# Patient Record
Sex: Female | Born: 1950 | Race: White | Hispanic: No
Health system: Southern US, Community
[De-identification: ages and names within clinical notes are randomized; demographics above are authoritative.]

---

## 2006-08-14 ENCOUNTER — Ambulatory Visit: Payer: Self-pay | Admitting: Gastroenterology

## 2008-09-15 ENCOUNTER — Emergency Department: Payer: Self-pay | Admitting: Emergency Medicine

## 2009-11-14 ENCOUNTER — Ambulatory Visit: Payer: Self-pay | Admitting: Family Medicine

## 2009-11-15 ENCOUNTER — Ambulatory Visit: Payer: Self-pay | Admitting: Gynecologic Oncology

## 2009-11-24 ENCOUNTER — Ambulatory Visit: Payer: Self-pay | Admitting: Obstetrics & Gynecology

## 2009-12-01 ENCOUNTER — Inpatient Hospital Stay: Payer: Self-pay | Admitting: Obstetrics & Gynecology

## 2009-12-09 ENCOUNTER — Ambulatory Visit: Payer: Self-pay | Admitting: Oncology

## 2009-12-09 LAB — CEA: CEA: 0.6 ng/mL (ref 0.0–4.7)

## 2009-12-13 ENCOUNTER — Ambulatory Visit: Payer: Self-pay | Admitting: Gynecologic Oncology

## 2009-12-15 ENCOUNTER — Ambulatory Visit: Payer: Self-pay | Admitting: Gynecologic Oncology

## 2010-01-10 ENCOUNTER — Ambulatory Visit: Payer: Self-pay | Admitting: General Surgery

## 2010-01-15 ENCOUNTER — Ambulatory Visit: Payer: Self-pay | Admitting: Gynecologic Oncology

## 2010-02-15 ENCOUNTER — Ambulatory Visit: Payer: Self-pay | Admitting: Gynecologic Oncology

## 2010-03-16 ENCOUNTER — Ambulatory Visit: Payer: Self-pay | Admitting: Gynecologic Oncology

## 2010-03-29 ENCOUNTER — Ambulatory Visit: Payer: Self-pay | Admitting: Family Medicine

## 2010-04-16 ENCOUNTER — Ambulatory Visit: Payer: Self-pay | Admitting: Gynecologic Oncology

## 2010-05-16 ENCOUNTER — Ambulatory Visit: Payer: Self-pay | Admitting: Gynecologic Oncology

## 2010-06-16 ENCOUNTER — Ambulatory Visit: Payer: Self-pay | Admitting: Gynecologic Oncology

## 2010-07-16 ENCOUNTER — Ambulatory Visit: Payer: Self-pay | Admitting: Gynecologic Oncology

## 2010-08-30 ENCOUNTER — Ambulatory Visit: Payer: Self-pay | Admitting: Family Medicine

## 2010-10-11 ENCOUNTER — Ambulatory Visit: Payer: Self-pay | Admitting: Oncology

## 2010-10-12 LAB — CA 125: CA 125: 14.8 U/mL (ref 0.0–34.0)

## 2010-10-16 ENCOUNTER — Ambulatory Visit: Payer: Self-pay | Admitting: Oncology

## 2010-11-16 ENCOUNTER — Ambulatory Visit: Payer: Self-pay | Admitting: Oncology

## 2010-12-05 ENCOUNTER — Ambulatory Visit: Payer: Self-pay | Admitting: Gynecologic Oncology

## 2010-12-12 ENCOUNTER — Inpatient Hospital Stay: Payer: Self-pay | Admitting: Surgery

## 2010-12-13 DIAGNOSIS — R000 Tachycardia, unspecified: Secondary | ICD-10-CM

## 2010-12-15 LAB — PATHOLOGY REPORT

## 2010-12-16 ENCOUNTER — Ambulatory Visit: Payer: Self-pay | Admitting: Oncology

## 2011-01-03 ENCOUNTER — Inpatient Hospital Stay: Payer: Self-pay | Admitting: Oncology

## 2011-01-16 ENCOUNTER — Emergency Department: Payer: Self-pay | Admitting: Emergency Medicine

## 2011-01-16 ENCOUNTER — Ambulatory Visit: Payer: Self-pay | Admitting: Oncology

## 2011-01-22 ENCOUNTER — Ambulatory Visit: Payer: Self-pay | Admitting: Surgery

## 2011-01-22 LAB — BASIC METABOLIC PANEL
Anion Gap: 7 (ref 7–16)
Calcium, Total: 8.8 mg/dL (ref 8.5–10.1)
Co2: 31 mmol/L (ref 21–32)
Creatinine: 0.62 mg/dL (ref 0.60–1.30)
EGFR (African American): >60
EGFR (Non-African Amer.): >60
Glucose: 113 mg/dL — ABNORMAL HIGH (ref 65–99)

## 2011-01-22 LAB — CBC WITH DIFFERENTIAL/PLATELET
Basophil %: 0.4 %
Eosinophil %: 2.8 %
HGB: 9 g/dL — ABNORMAL LOW (ref 12.0–16.0)
Lymphocyte %: 19.2 %
MCH: 27.8 pg (ref 26.0–34.0)
MCHC: 33 g/dL (ref 32.0–36.0)
Monocyte #: 0.5 10*3/uL (ref 0.0–0.7)
Monocyte %: 8.5 %
Neutrophil %: 69.1 %
RBC: 3.24 10*6/uL — ABNORMAL LOW (ref 3.80–5.20)
WBC: 6 10*3/uL (ref 3.6–11.0)

## 2011-01-24 ENCOUNTER — Inpatient Hospital Stay: Payer: Self-pay | Admitting: Surgery

## 2011-01-25 LAB — CBC WITH DIFFERENTIAL/PLATELET
Basophil %: 0.3 %
Eosinophil #: 0.1 10*3/uL (ref 0.0–0.7)
Eosinophil %: 1.9 %
Lymphocyte #: 1 10*3/uL (ref 1.0–3.6)
MCH: 27.8 pg (ref 26.0–34.0)
MCHC: 32.6 g/dL (ref 32.0–36.0)
MCV: 85 fL (ref 80–100)
Monocyte #: 0.5 10*3/uL (ref 0.0–0.7)
Monocyte %: 8.1 %
Neutrophil #: 5 10*3/uL (ref 1.4–6.5)
Neutrophil %: 74.5 %
Platelet: 303 10*3/uL (ref 150–440)
RBC: 3.14 10*6/uL — ABNORMAL LOW (ref 3.80–5.20)

## 2011-01-25 LAB — BASIC METABOLIC PANEL
Anion Gap: 7 (ref 7–16)
BUN: 8 mg/dL (ref 7–18)
Chloride: 101 mmol/L (ref 98–107)
Co2: 29 mmol/L (ref 21–32)
Creatinine: 0.58 mg/dL — ABNORMAL LOW (ref 0.60–1.30)
Osmolality: 274 (ref 275–301)
Potassium: 4.4 mmol/L (ref 3.5–5.1)
Sodium: 137 mmol/L (ref 136–145)

## 2011-01-27 LAB — CBC WITH DIFFERENTIAL/PLATELET
Basophil #: 0 10*3/uL (ref 0.0–0.1)
Basophil %: 0.1 %
Eosinophil #: 0.2 10*3/uL (ref 0.0–0.7)
HCT: 23.3 % — ABNORMAL LOW (ref 35.0–47.0)
Lymphocyte #: 0.9 10*3/uL — ABNORMAL LOW (ref 1.0–3.6)
Lymphocyte %: 18.4 %
MCH: 27.8 pg (ref 26.0–34.0)
MCHC: 32.8 g/dL (ref 32.0–36.0)
MCV: 85 fL (ref 80–100)
Monocyte #: 0.4 10*3/uL (ref 0.0–0.7)
Neutrophil #: 3.5 10*3/uL (ref 1.4–6.5)
RDW: 15.5 % — ABNORMAL HIGH (ref 11.5–14.5)

## 2011-01-27 LAB — BASIC METABOLIC PANEL
Anion Gap: 9 (ref 7–16)
Calcium, Total: 8.2 mg/dL — ABNORMAL LOW (ref 8.5–10.1)
Chloride: 102 mmol/L (ref 98–107)
Co2: 29 mmol/L (ref 21–32)
EGFR (African American): >60
Glucose: 105 mg/dL — ABNORMAL HIGH (ref 65–99)
Sodium: 140 mmol/L (ref 136–145)

## 2011-01-27 LAB — URINALYSIS, COMPLETE
Bacteria: NONE SEEN
Bilirubin,UR: NEGATIVE
Ketone: NEGATIVE
Ph: 7 (ref 4.5–8.0)
Protein: NEGATIVE
Specific Gravity: 1.002 (ref 1.003–1.030)
Squamous Epithelial: NONE SEEN
WBC UR: 45 /HPF (ref 0–5)

## 2011-01-29 LAB — URINE CULTURE

## 2011-02-09 LAB — CBC CANCER CENTER
Basophil #: 0 x10 3/mm (ref 0.0–0.1)
Basophil %: 0.6 %
Eosinophil #: 0.2 x10 3/mm (ref 0.0–0.7)
Eosinophil %: 3.4 %
HCT: 30.3 % — ABNORMAL LOW (ref 35.0–47.0)
Lymphocyte #: 1.6 x10 3/mm (ref 1.0–3.6)
MCHC: 32.2 g/dL (ref 32.0–36.0)
MCV: 84.8 fL (ref 80–100)
Monocyte #: 0.4 x10 3/mm (ref 0.0–0.7)
Neutrophil %: 67.3 %
Platelet: 428 x10 3/mm (ref 150–440)
RDW: 16.1 % — ABNORMAL HIGH (ref 11.5–14.5)
WBC: 6.9 x10 3/mm (ref 3.6–11.0)

## 2011-02-09 LAB — COMPREHENSIVE METABOLIC PANEL
Albumin: 2.8 g/dL — ABNORMAL LOW (ref 3.4–5.0)
Alkaline Phosphatase: 95 U/L (ref 50–136)
BUN: 9 mg/dL (ref 7–18)
Calcium, Total: 9.7 mg/dL (ref 8.5–10.1)
Co2: 31 mmol/L (ref 21–32)
SGOT(AST): 19 U/L (ref 15–37)
SGPT (ALT): 16 U/L
Sodium: 139 mmol/L (ref 136–145)
Total Protein: 7.2 g/dL (ref 6.4–8.2)

## 2011-02-16 ENCOUNTER — Ambulatory Visit: Payer: Self-pay | Admitting: Oncology

## 2011-02-23 LAB — COMPREHENSIVE METABOLIC PANEL
Albumin: 2.6 g/dL — ABNORMAL LOW (ref 3.4–5.0)
Alkaline Phosphatase: 151 U/L — ABNORMAL HIGH (ref 50–136)
Anion Gap: 9 (ref 7–16)
BUN: 16 mg/dL (ref 7–18)
Bilirubin,Total: 0.2 mg/dL (ref 0.2–1.0)
Calcium, Total: 9 mg/dL (ref 8.5–10.1)
Chloride: 97 mmol/L — ABNORMAL LOW (ref 98–107)
Co2: 29 mmol/L (ref 21–32)
Creatinine: 0.75 mg/dL (ref 0.60–1.30)
EGFR (African American): >60
Glucose: 218 mg/dL — ABNORMAL HIGH (ref 65–99)
Osmolality: 278 (ref 275–301)
Potassium: 3.6 mmol/L (ref 3.5–5.1)
SGOT(AST): 13 U/L — ABNORMAL LOW (ref 15–37)
Sodium: 135 mmol/L — ABNORMAL LOW (ref 136–145)
Total Protein: 6.7 g/dL (ref 6.4–8.2)

## 2011-02-23 LAB — CBC CANCER CENTER
Basophil #: 0 x10 3/mm (ref 0.0–0.1)
Basophil %: 0.1 %
Eosinophil %: 1.5 %
HCT: 30.1 % — ABNORMAL LOW (ref 35.0–47.0)
HGB: 9.7 g/dL — ABNORMAL LOW (ref 12.0–16.0)
Lymphocyte #: 1.3 x10 3/mm (ref 1.0–3.6)
MCH: 27.1 pg (ref 26.0–34.0)
MCHC: 32.2 g/dL (ref 32.0–36.0)
MCV: 84.1 fL (ref 80–100)
Monocyte #: 0 x10 3/mm (ref 0.0–0.7)
RBC: 3.58 10*6/uL — ABNORMAL LOW (ref 3.80–5.20)

## 2011-03-05 LAB — CBC CANCER CENTER
Basophil #: 0.1 x10 3/mm (ref 0.0–0.1)
Basophil %: 0.5 %
Eosinophil #: 0.2 x10 3/mm (ref 0.0–0.7)
Lymphocyte %: 16.1 %
MCH: 26.3 pg (ref 26.0–34.0)
MCV: 83 fL (ref 80–100)
Neutrophil %: 78.2 %
RBC: 3.84 10*6/uL (ref 3.80–5.20)
RDW: 16.4 % — ABNORMAL HIGH (ref 11.5–14.5)
WBC: 14.5 x10 3/mm — ABNORMAL HIGH (ref 3.6–11.0)

## 2011-03-09 LAB — CBC CANCER CENTER
Basophil #: 0.1 x10 3/mm (ref 0.0–0.1)
Eosinophil #: 0.2 x10 3/mm (ref 0.0–0.7)
Lymphocyte %: 15.2 %
MCH: 27 pg (ref 26.0–34.0)
MCHC: 32.9 g/dL (ref 32.0–36.0)
MCV: 82.3 fL (ref 80–100)
Monocyte #: 0.8 x10 3/mm — ABNORMAL HIGH (ref 0.0–0.7)
Neutrophil %: 75.4 %
Platelet: 535 x10 3/mm — ABNORMAL HIGH (ref 150–440)
RDW: 16.9 % — ABNORMAL HIGH (ref 11.5–14.5)
WBC: 11.8 x10 3/mm — ABNORMAL HIGH (ref 3.6–11.0)

## 2011-03-09 LAB — BASIC METABOLIC PANEL
Anion Gap: 9 (ref 7–16)
BUN: 11 mg/dL (ref 7–18)
Calcium, Total: 9.5 mg/dL (ref 8.5–10.1)
Chloride: 102 mmol/L (ref 98–107)
Co2: 31 mmol/L (ref 21–32)
Creatinine: 0.8 mg/dL (ref 0.60–1.30)
Potassium: 4.1 mmol/L (ref 3.5–5.1)

## 2011-03-13 LAB — URINALYSIS, COMPLETE
Bilirubin,UR: NEGATIVE
Blood: NEGATIVE
Glucose,UR: NEGATIVE mg/dL (ref 0–75)
Hyaline Cast: 4
Specific Gravity: 1.033 (ref 1.003–1.030)
Squamous Epithelial: 11

## 2011-03-14 LAB — URINE CULTURE

## 2011-03-16 ENCOUNTER — Ambulatory Visit: Payer: Self-pay | Admitting: Oncology

## 2011-03-16 LAB — CBC CANCER CENTER
Basophil #: 0.1 x10 3/mm (ref 0.0–0.1)
Basophil %: 1 %
Eosinophil #: 0.6 x10 3/mm (ref 0.0–0.7)
Eosinophil %: 4.5 %
HCT: 28 % — ABNORMAL LOW (ref 35.0–47.0)
HGB: 9.2 g/dL — ABNORMAL LOW (ref 12.0–16.0)
Lymphocyte #: 1.7 x10 3/mm (ref 1.0–3.6)
MCH: 27.1 pg (ref 26.0–34.0)
MCV: 82 fL (ref 80–100)
Monocyte #: 0.2 x10 3/mm (ref 0.0–0.7)
Monocyte %: 1.7 %
Neutrophil %: 80 %
Platelet: 475 x10 3/mm — ABNORMAL HIGH (ref 150–440)
RBC: 3.41 10*6/uL — ABNORMAL LOW (ref 3.80–5.20)
RDW: 17 % — ABNORMAL HIGH (ref 11.5–14.5)
WBC: 13.6 x10 3/mm — ABNORMAL HIGH (ref 3.6–11.0)

## 2011-03-30 LAB — BASIC METABOLIC PANEL
Anion Gap: 8 (ref 7–16)
BUN: 10 mg/dL (ref 7–18)
Calcium, Total: 9.3 mg/dL (ref 8.5–10.1)
Co2: 31 mmol/L (ref 21–32)
Creatinine: 0.79 mg/dL (ref 0.60–1.30)
EGFR (African American): >60
EGFR (Non-African Amer.): >60
Glucose: 170 mg/dL — ABNORMAL HIGH (ref 65–99)
Osmolality: 282 (ref 275–301)

## 2011-03-30 LAB — CBC CANCER CENTER
Basophil %: 0.7 %
Eosinophil %: 2.5 %
Lymphocyte #: 1.9 x10 3/mm (ref 1.0–3.6)
Lymphocyte %: 15.9 %
MCHC: 32.1 g/dL (ref 32.0–36.0)
MCV: 81.3 fL (ref 80–100)
Monocyte %: 6 %
Neutrophil %: 74.9 %
WBC: 11.8 x10 3/mm — ABNORMAL HIGH (ref 3.6–11.0)

## 2011-04-05 LAB — CBC CANCER CENTER
Basophil #: 0.1 x10 3/mm (ref 0.0–0.1)
Basophil %: 0.3 %
Eosinophil #: 0.6 x10 3/mm (ref 0.0–0.7)
Eosinophil %: 2.7 %
HGB: 8.9 g/dL — ABNORMAL LOW (ref 12.0–16.0)
Lymphocyte %: 7.3 %
MCH: 27.1 pg (ref 26.0–34.0)
MCHC: 32.9 g/dL (ref 32.0–36.0)
MCV: 82.6 fL (ref 80–100)
Monocyte #: 0.1 x10 3/mm (ref 0.0–0.7)
Platelet: 391 x10 3/mm (ref 150–440)
RBC: 3.3 10*6/uL — ABNORMAL LOW (ref 3.80–5.20)

## 2011-04-16 ENCOUNTER — Ambulatory Visit: Payer: Self-pay | Admitting: Oncology

## 2011-04-27 LAB — BASIC METABOLIC PANEL
Anion Gap: 7 (ref 7–16)
Calcium, Total: 9.2 mg/dL (ref 8.5–10.1)
Chloride: 102 mmol/L (ref 98–107)
Co2: 30 mmol/L (ref 21–32)
Glucose: 139 mg/dL — ABNORMAL HIGH (ref 65–99)
Osmolality: 277 (ref 275–301)

## 2011-04-27 LAB — CBC CANCER CENTER
Eosinophil #: 0.6 x10 3/mm (ref 0.0–0.7)
Eosinophil %: 5.6 %
HCT: 28.3 % — ABNORMAL LOW (ref 35.0–47.0)
Lymphocyte #: 1.7 x10 3/mm (ref 1.0–3.6)
MCHC: 31.7 g/dL — ABNORMAL LOW (ref 32.0–36.0)
MCV: 83 fL (ref 80–100)
Monocyte #: 0.8 x10 3/mm (ref 0.2–0.9)
Neutrophil %: 68.2 %
Platelet: 399 x10 3/mm (ref 150–440)
RBC: 3.43 10*6/uL — ABNORMAL LOW (ref 3.80–5.20)
WBC: 9.8 x10 3/mm (ref 3.6–11.0)

## 2011-05-04 LAB — CBC CANCER CENTER
Basophil #: 0.1 x10 3/mm (ref 0.0–0.1)
Basophil %: 0.6 %
Eosinophil #: 0.5 x10 3/mm (ref 0.0–0.7)
Eosinophil %: 4.5 %
Lymphocyte #: 1.3 x10 3/mm (ref 1.0–3.6)
MCHC: 32 g/dL (ref 32.0–36.0)
Neutrophil #: 8.3 x10 3/mm — ABNORMAL HIGH (ref 1.4–6.5)
Platelet: 267 x10 3/mm (ref 150–440)
RBC: 3.28 10*6/uL — ABNORMAL LOW (ref 3.80–5.20)
RDW: 19.2 % — ABNORMAL HIGH (ref 11.5–14.5)

## 2011-05-04 LAB — COMPREHENSIVE METABOLIC PANEL
Albumin: 3 g/dL — ABNORMAL LOW (ref 3.4–5.0)
Anion Gap: 8 (ref 7–16)
Bilirubin,Total: 0.1 mg/dL — ABNORMAL LOW (ref 0.2–1.0)
Calcium, Total: 9.3 mg/dL (ref 8.5–10.1)
Chloride: 95 mmol/L — ABNORMAL LOW (ref 98–107)
Creatinine: 0.6 mg/dL (ref 0.60–1.30)
EGFR (African American): >60
EGFR (Non-African Amer.): >60
Glucose: 98 mg/dL (ref 65–99)
SGOT(AST): 16 U/L (ref 15–37)

## 2011-05-16 ENCOUNTER — Ambulatory Visit: Payer: Self-pay | Admitting: Oncology

## 2011-06-15 LAB — CBC CANCER CENTER
Eosinophil #: 0.4 x10 3/mm (ref 0.0–0.7)
Eosinophil %: 4.2 %
HCT: 26.3 % — ABNORMAL LOW (ref 35.0–47.0)
HGB: 8.1 g/dL — ABNORMAL LOW (ref 12.0–16.0)
MCHC: 31 g/dL — ABNORMAL LOW (ref 32.0–36.0)
MCV: 81 fL (ref 80–100)
Monocyte #: 0.7 x10 3/mm (ref 0.2–0.9)
Monocyte %: 7.7 %
Neutrophil %: 68.1 %
RBC: 3.24 10*6/uL — ABNORMAL LOW (ref 3.80–5.20)
RDW: 19.2 % — ABNORMAL HIGH (ref 11.5–14.5)
WBC: 8.7 x10 3/mm (ref 3.6–11.0)

## 2011-06-15 LAB — COMPREHENSIVE METABOLIC PANEL
Albumin: 2.6 g/dL — ABNORMAL LOW (ref 3.4–5.0)
Alkaline Phosphatase: 98 U/L (ref 50–136)
BUN: 8 mg/dL (ref 7–18)
Calcium, Total: 9.1 mg/dL (ref 8.5–10.1)
Creatinine: 0.66 mg/dL (ref 0.60–1.30)
EGFR (African American): >60
EGFR (Non-African Amer.): >60
Total Protein: 6.8 g/dL (ref 6.4–8.2)

## 2011-06-16 ENCOUNTER — Ambulatory Visit: Payer: Self-pay | Admitting: Oncology

## 2011-07-16 ENCOUNTER — Ambulatory Visit: Payer: Self-pay | Admitting: Oncology

## 2011-07-30 LAB — CBC CANCER CENTER
HCT: 27.9 % — ABNORMAL LOW (ref 35.0–47.0)
HGB: 8.4 g/dL — ABNORMAL LOW (ref 12.0–16.0)
Lymphocytes: 8 %
MCH: 24.1 pg — ABNORMAL LOW (ref 26.0–34.0)
MCV: 80 fL (ref 80–100)
RDW: 20.8 % — ABNORMAL HIGH (ref 11.5–14.5)
Segmented Neutrophils: 84 %
WBC: 31 x10 3/mm — ABNORMAL HIGH (ref 3.6–11.0)

## 2011-08-07 LAB — CBC CANCER CENTER
Basophil %: 0.3 %
Eosinophil #: 0.1 x10 3/mm (ref 0.0–0.7)
Eosinophil %: 0.4 %
HCT: 28.2 % — ABNORMAL LOW (ref 35.0–47.0)
Lymphocyte #: 1.1 x10 3/mm (ref 1.0–3.6)
MCH: 24.4 pg — ABNORMAL LOW (ref 26.0–34.0)
MCHC: 30.7 g/dL — ABNORMAL LOW (ref 32.0–36.0)
MCV: 80 fL (ref 80–100)
Monocyte #: 1.2 x10 3/mm — ABNORMAL HIGH (ref 0.2–0.9)
Neutrophil %: 90 %
Platelet: 430 x10 3/mm (ref 150–440)
RBC: 3.55 10*6/uL — ABNORMAL LOW (ref 3.80–5.20)
RDW: 21 % — ABNORMAL HIGH (ref 11.5–14.5)

## 2011-08-16 ENCOUNTER — Ambulatory Visit: Payer: Self-pay | Admitting: Oncology

## 2011-09-16 DEATH — deceased

## 2011-12-05 IMAGING — CT CT CHEST W/ CM
1 series · 16 of 33 positions shown, 20 images · IV contrast (APPLIED)
Comparison: none

REASON FOR EXAM: STAT CR [id number] ask for Eteoklis Sorokos or Talbot chest
pain  dyspnea  eval PE
COMMENTS:

PROCEDURE:     SURY - SURY CHEST ( FOR PE ) W  - January 18, 2010  [DATE]
RESULT:     History: Chest pain.

[Series 4: soft tissue · axial · 0.64mm/px · z∈[+36,+315]mm · 16 of 101 slices shown, 20 images]
[im 4/101  mediastinal]
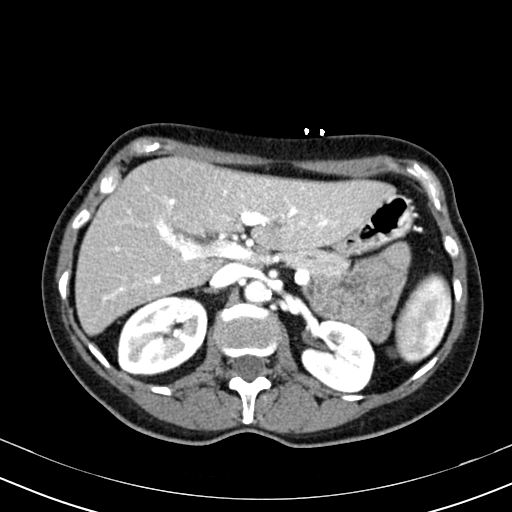
[im 4/101  lung]
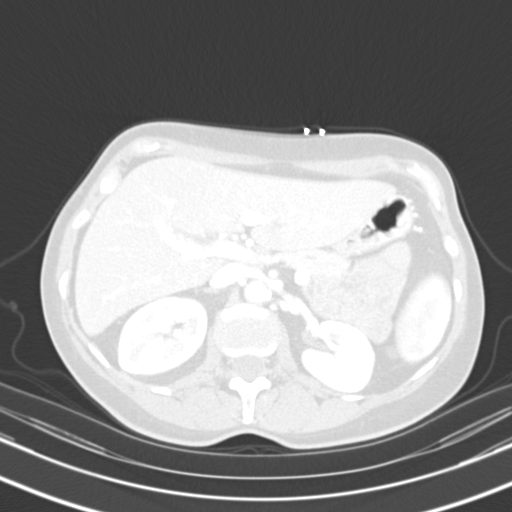
[im 12/101  lung]
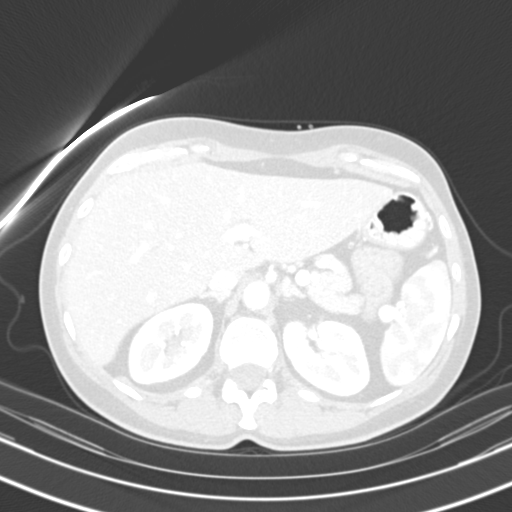
[im 19/101  lung]
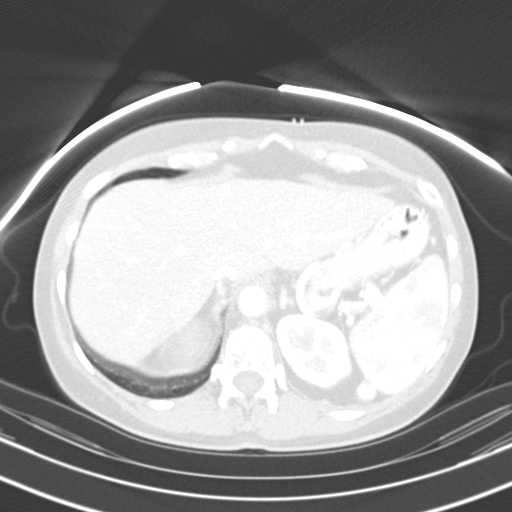
[im 23/101  lung]
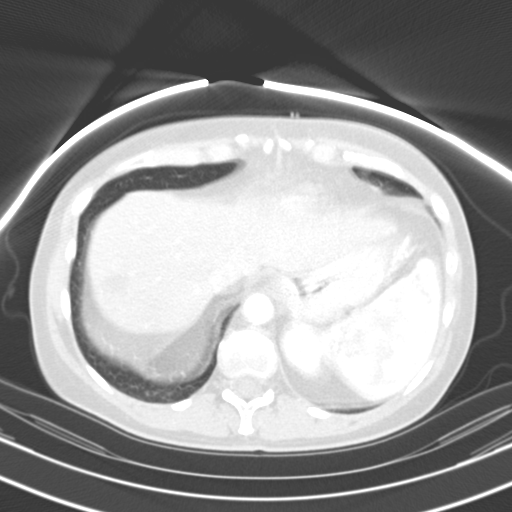
[im 30/101  mediastinal]
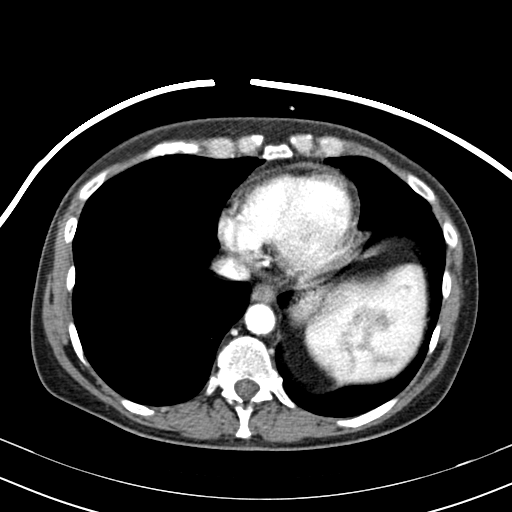
[im 30/101  lung]
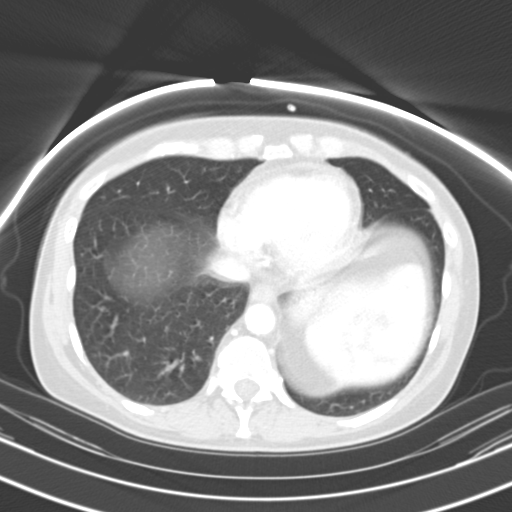
[im 38/101  lung]
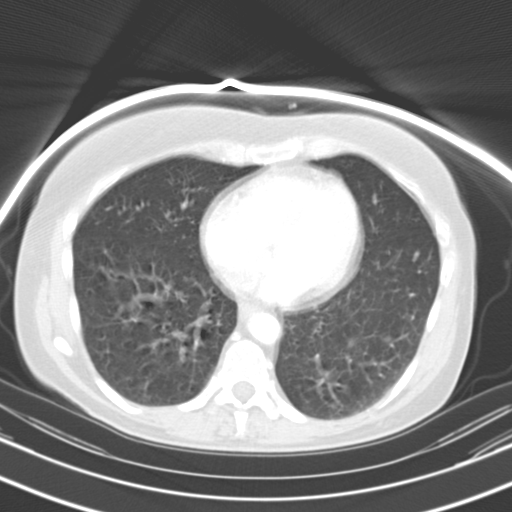
[im 45/101  lung]
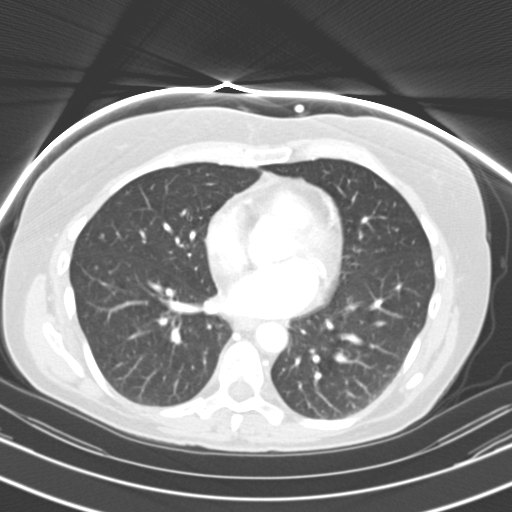
[im 49/101  lung]
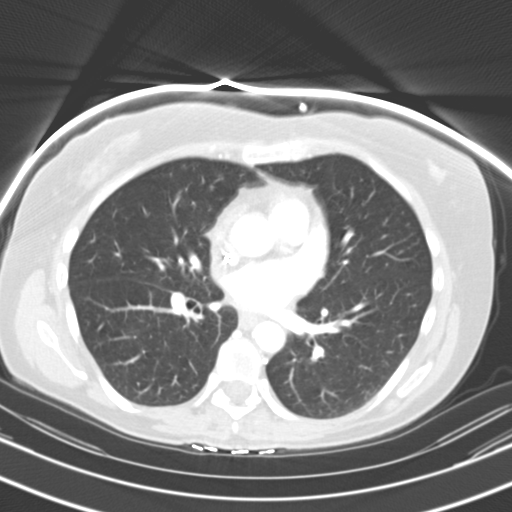
[im 54/101  mediastinal]
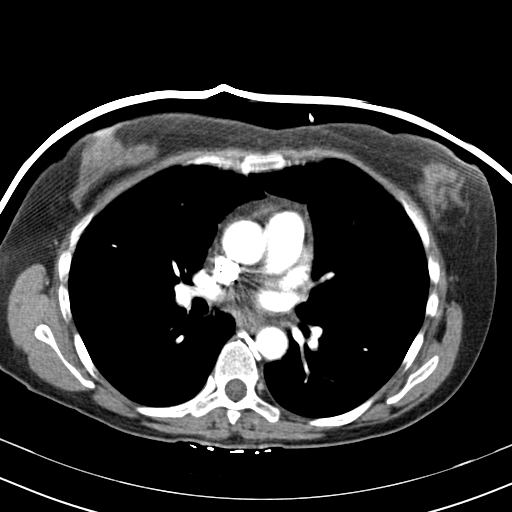
[im 54/101  lung]
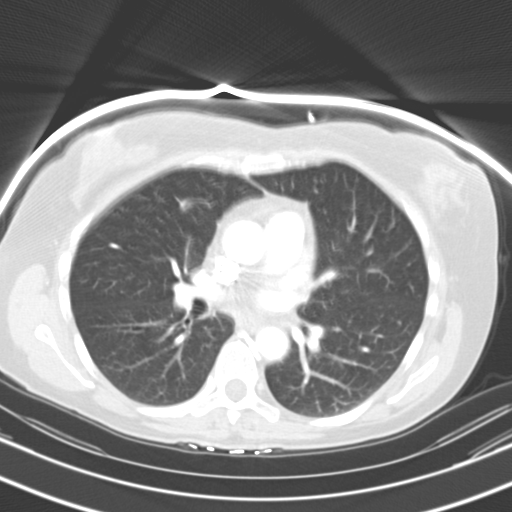
[im 60/101  lung]
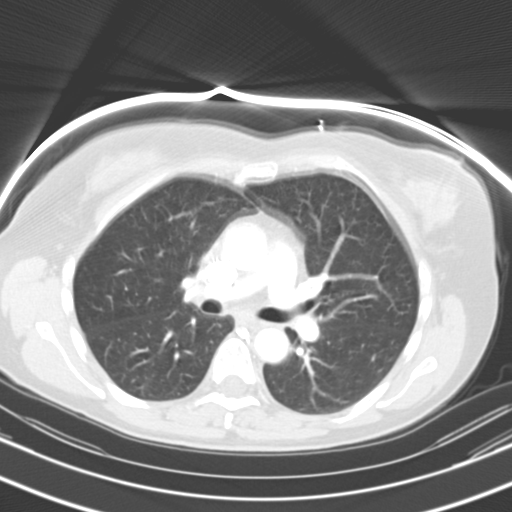
[im 63/101  lung]
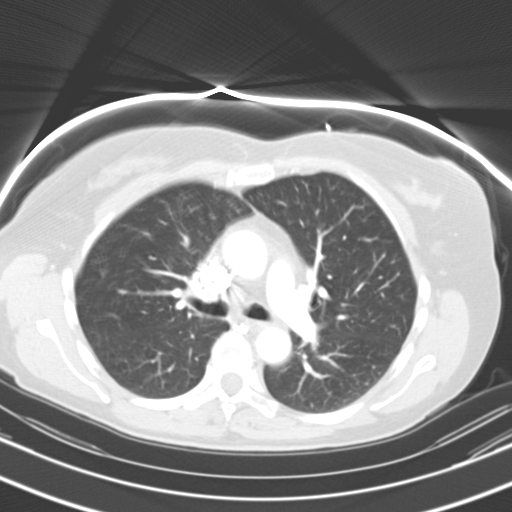
[im 71/101  lung]
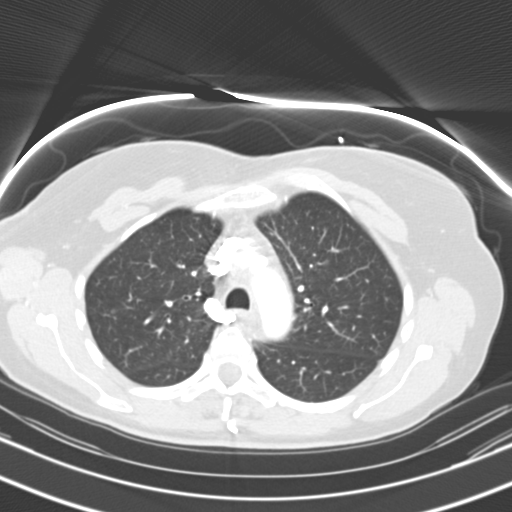
[im 78/101  mediastinal]
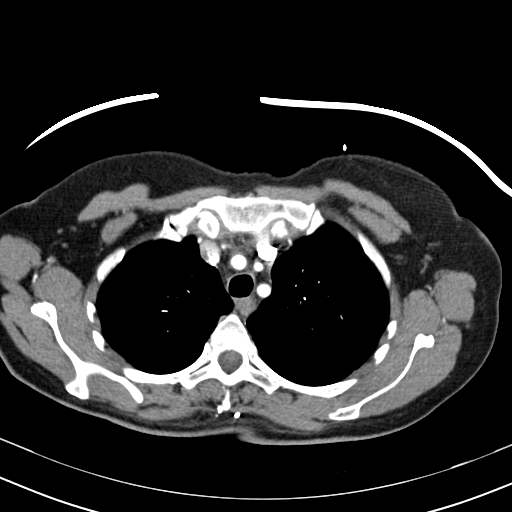
[im 78/101  lung]
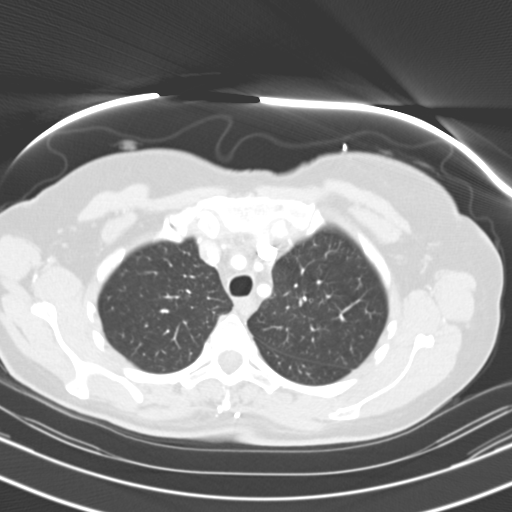
[im 82/101  lung]
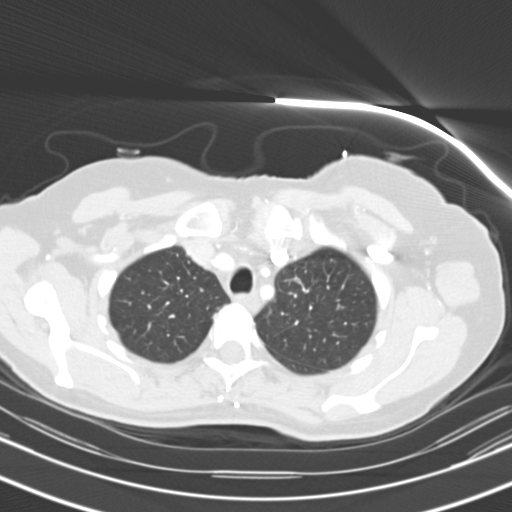
[im 89/101  lung]
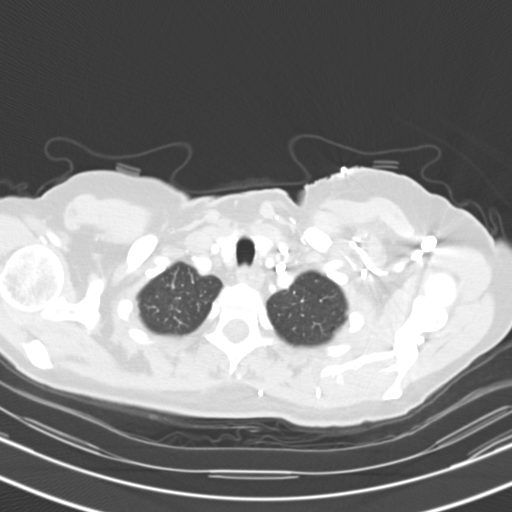
[im 97/101  lung]
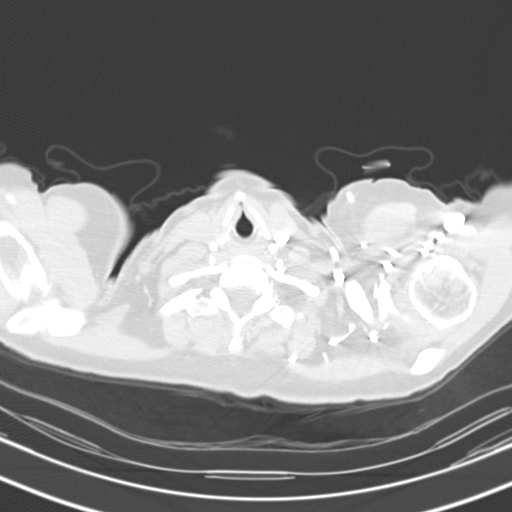

[16 of 33 positions shown; findings below may reference images not displayed]

FINDINGS: Standard CT chest obtained with 100 cc of Bsovue-CVT. Pulmonary
arteries normal. Adrenals normal. Thoracic aorta adrenals normal is
low-density lesion in the right lobe liver liver. This is indeterminate by
CT this could represent a solid lesion and hepatic ultrasound suggested for
further evaluation. A 6 mm nodule noted in right upper lobe.
IMPRESSION: 1. 6 mm right upper lobe nodule.
2. No acute abnormality.

## 2012-11-03 IMAGING — CR DG CHEST 1V
1 series · 1 of 1 positions shown · non-contrast
Comparison: none

REASON FOR EXAM: Complete VQ scan process per Nuc Med
COMMENTS:

[w chest pa]
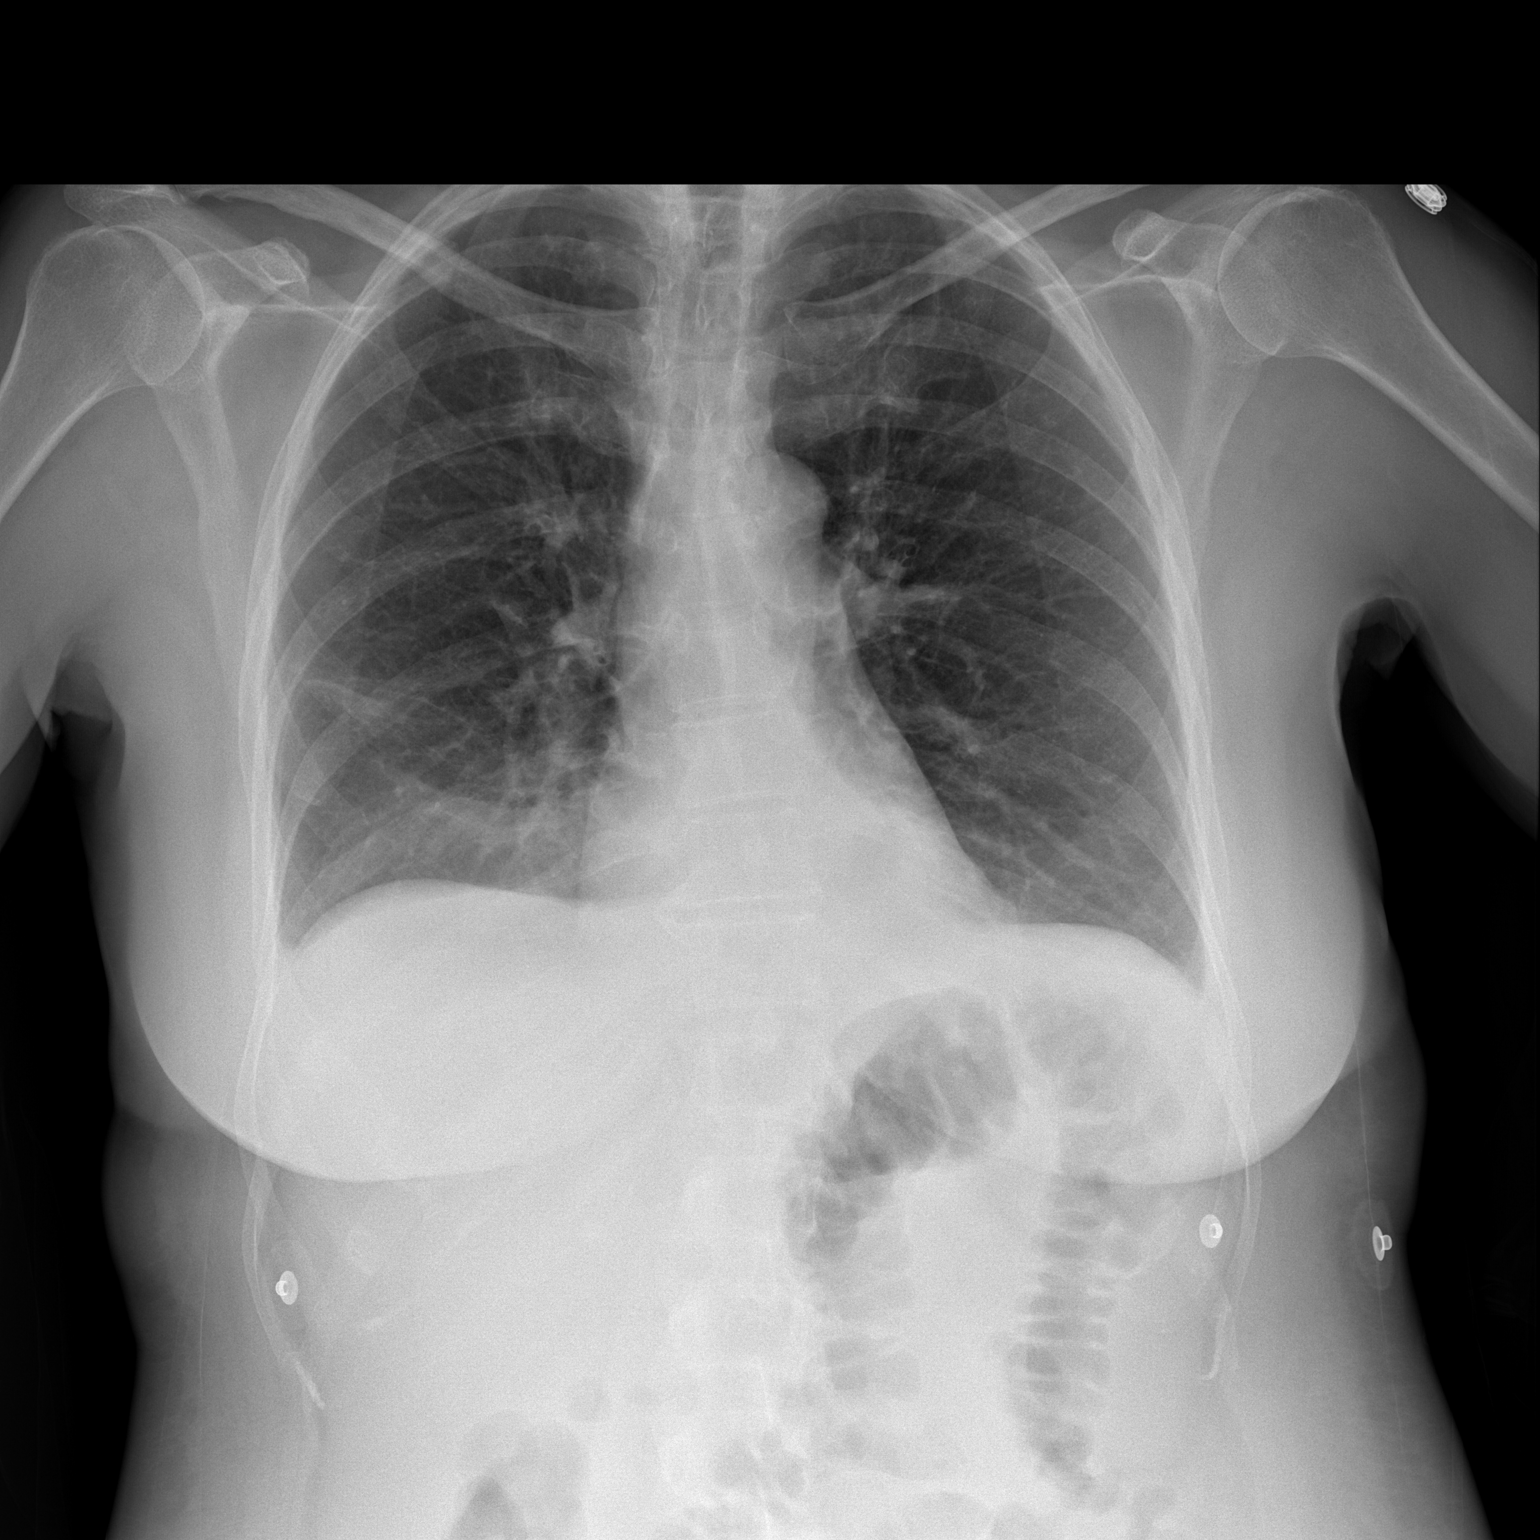

[1 of 1 positions shown; findings below may reference images not displayed]

PROCEDURE:     DXR - DXR CHEST 1 VIEWAP OR PA  - December 18, 2010 [DATE]

RESULT:     Comparison is made to the prior exam of 12/14/2010. There is
increased density at the right base compatible with atelectasis.
Additionally, there is thickening of the retrocardiac markings on the left
suspicious for minimal atelectasis or pneumonia. The upper lobes bilaterally
are clear. Heart size is normal. No pleural effusion is seen.
IMPRESSION: 1. There is increased density at both lung bases compatible with atelectasis
or pneumonia.
2. No definite pleural effusions are seen.
3. Heart size is normal.

## 2012-12-10 IMAGING — CR DG CHEST 1V PORT
1 series · 1 of 1 positions shown · non-contrast
Comparison: none

REASON FOR EXAM: Line placement
COMMENTS:

[portable]
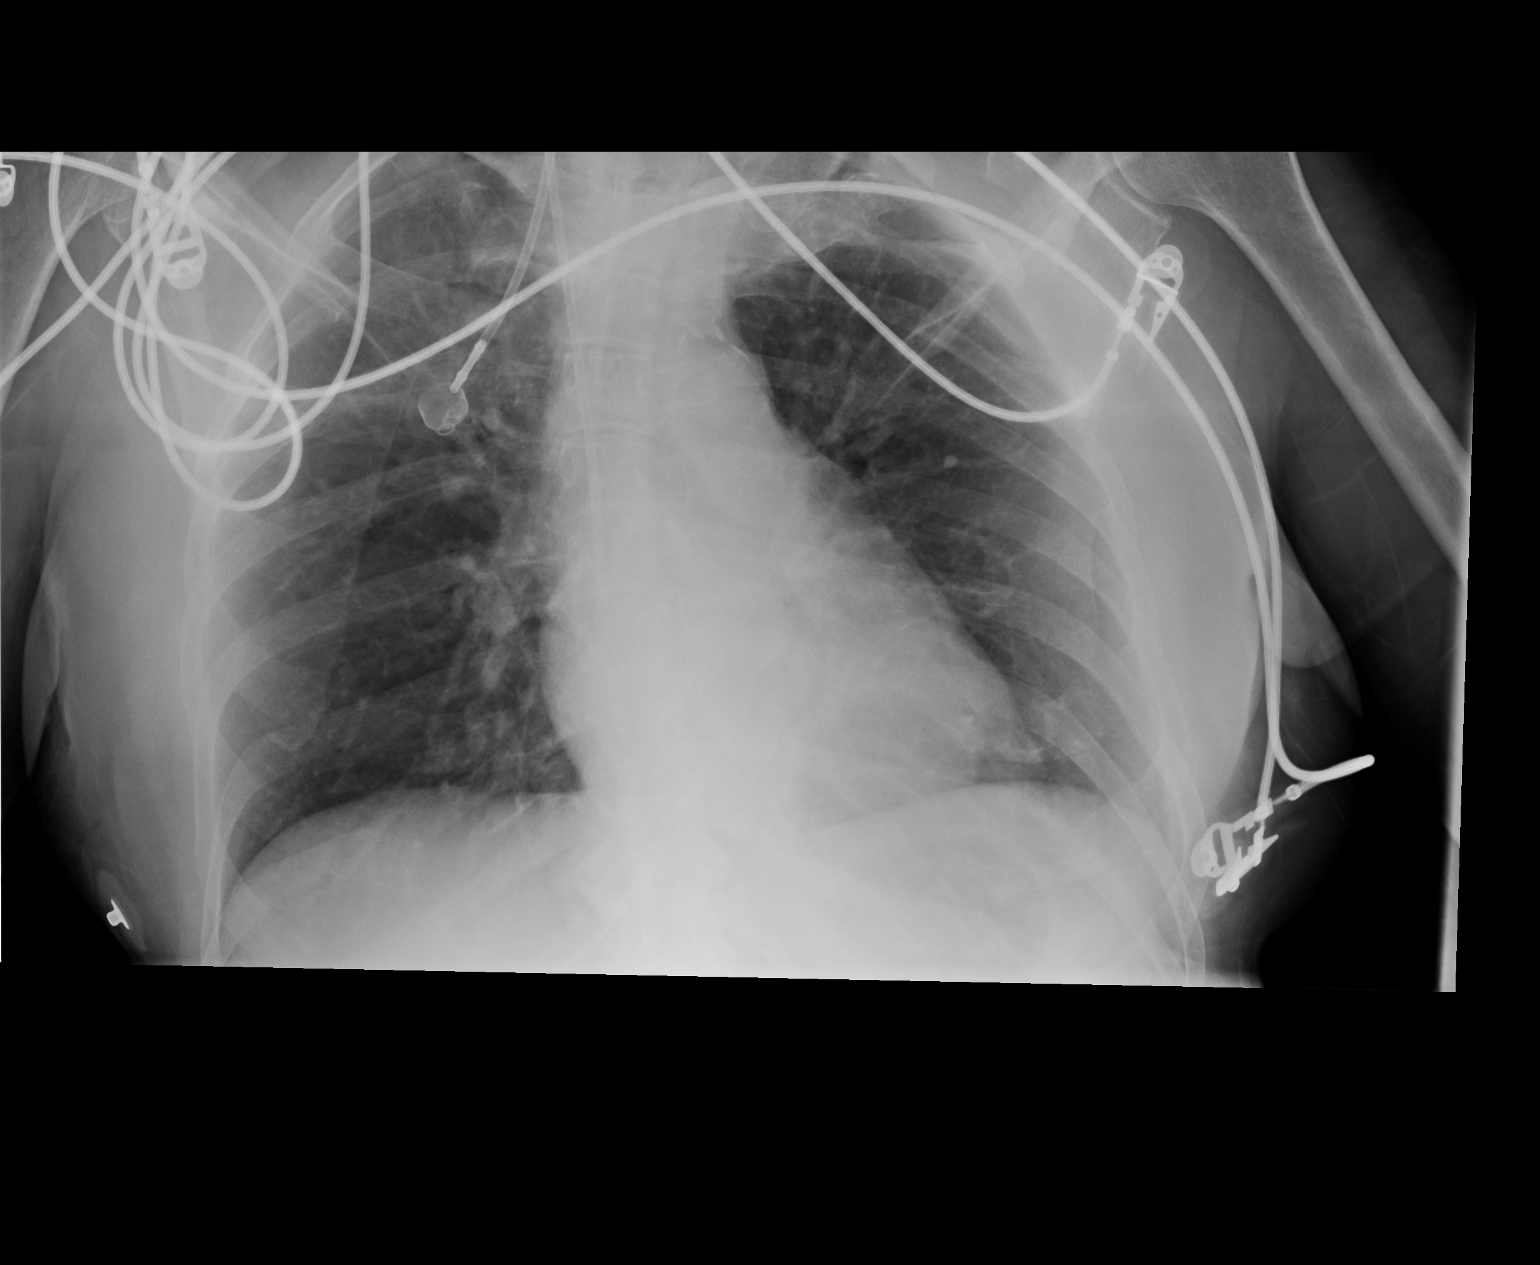

[1 of 1 positions shown; findings below may reference images not displayed]

PROCEDURE:     DXR - DXR PORTABLE CHEST SINGLE VIEW  - January 24, 2011 [DATE]

RESULT:     Comparison is made to a prior exam of 12/18/2010. A Port-A-Cath
is present with the tip projected at the junction of the superior vena cava
and right atrium. No pneumothorax is seen. The lung fields are clear of
infiltrate. The previously noted increased density at the lung base is no
longer seen. Monitoring electrodes are present.
IMPRESSION: 1. A Port-A-Cath is present with the tip projected at the junction of the
right atrium and superior vena cava.
2. No pneumothorax is seen.
3. Lung fields are clear.
4. Heart size is normal.

## 2014-05-04 NOTE — Consult Note (Signed)
Reason for Visit: This 64 year old Female patient presents to the clinic for initial evaluation of  Abdominal sarcoma .   Referred by Dr. Doylene Canning.  Diagnosis:   Chief Complaint/Diagnosis   64 year old female with progressive sarcoma involving her abdominal stoma with bleeding and increasing abdominal pain now seen for possible palliative radiation therapy   Pathology Report Pathology report reviewed    Imaging Report Prior CT scan reviewed with Dr. 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 She was initially treated with Taxotere and gemcitabine. She then underwent debulking surgery November of 2000 well for recurrent undifferentiated sarcoma. 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 Had a diverting colostomy in January 2013 followed up with Adriamycin as a single agent. She is now developed small bowel fistula and is currently under hospice care. She has recently presented with a rapid growth of tumor into her up abdominal wall stoma causing significant bleeding. She is also having increasing abdominal pain and I been asked to evaluate the patient for possible palliative radiation therapy. Her last CT scan was back in December of 2000 well. I reviewed that scan with Dr. Doylene Canning.  Past Hx:    HX: Diverticulosis:    Arrythmias:    H/O Seizures related to Norvasc:    Retroperitoneal sarcoma:    HX: Seizures:    HTN:    tumors removed in colon and stomach: Nov 2012   Colon Resection: 01-Dec-2009   Removal of porta Cath in office: May 2012   Porta Cath: 10-Jan-2010   colonoscopy:    Hysterectomy:   Past, Family and Social History:   Past Medical History positive     Cardiovascular hypertension; Cardiac arrhythmia    Gastrointestinal diverticulitis    Neurological/Psychiatric Seizure disorder    Past Surgical History Colonoscopy, hysterectomy, multiple bowel surgeries as described above.    Family History noncontributory    Social History noncontributory    Additional Past Medical and Surgical History Seen accompanied by 2 daughters today   Allergies:   Norvasc: Seizures  Lisinopril: Cough  Home Meds:  Home Medications: Medication Instructions Status  Duragesic-100 100 mcg/hr transdermal film, extended release 1 PATCH transdermal every 72 hours Active  predniSONE 20 mg oral tablet 1 tab(s) orally once a day Active  acetaminophen-HYDROcodone 325 mg-5 mg oral tablet  Active  promethazine 25 mg tablet 1 tab(s) orally every 6 hours x 10 days, As Needed- for Nausea, Vomiting  Active  CeleXA 20 mg tablet 1 tab(s) orally once a day (in the morning) Active  Levsin 0.125 mg tablet 1 tab(s) orally 4 times a day x 15 days, As Needed Active  omeprazole 20 mg delayed release capsule 1 cap(s) orally once a day x 30 days Active  multivitamin 1 tab(s) orally once a day (in the morning) Active  Duragesic-100 1 patch transdermal every 3 days Active   Review of Systems:   Performance Status (ECOG) 0    Skin negative    Ophthalmologic negative    ENMT negative    Respiratory and Thorax negative    Cardiovascular negative    Gastrointestinal negative    Genitourinary see HPI    Musculoskeletal negative    Neurological negative  Psychiatric negative    Hematology/Lymphatics negative    Endocrine negative    Allergic/Immunologic negative   Nursing Notes:  Nursing Vital Signs and Chemo Nursing Nursing Notes: *CC Vital Signs Flowsheet:   15-Jul-13 10:57   Temp Temperature 97.8   Pulse Pulse 110   Respirations Respirations 18   SBP SBP 130   DBP DBP 70   Pain Scale (0-10)  0   Current Weight (kg) (kg) 49.4   Height (cm)  centimeters 157.4   BSA (m2) 1.4   Physical Exam:  General/Skin/HEENT:   Additional PE Thin female in NAD. Her right abdominal see stoma has marked tumor involvement with active bleeding into her colostomy bag. Her abdominal cavity appears to be filled with tumor with decreased breast sounds all 4 quadrants and marked extension the abdomen by palpation. Lungs are clear to A&P cardiac examination shows regular rate and rhythm.   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  Plan:   at this time like to try no prevent any further bleeding with a short palliative course of radiation therapy. Patient is currently under hospice care. I will start her on 2000 cGy in 10 fractions and evaluate for response. I would not like to go with significant high doses since she does have a history of fistula formation and this might exacerbate or worsen her overall condition. We will try 2 weeks of treatment and evaluate for response at that time. Risks and benefits of treatment including diarrhea possible urinary frequency and urgency, fatigue and alteration and blood counts were all discussed in detail with the patient and her family. I have set her up for simulation tomorrow.  I would like to take this opportunity to thank you for allowing me to continue to participate in this patient's care.  CC Referral:   cc: Dr. Gabriel Cirriheryl Wicker   Electronic Signatures: Rushie Chestnuthrystal, Gordy CouncilmanGlenn S (MD)  (Signed 15-Jul-13 15:46)  Authored: HPI, Diagnosis, Past Hx, PFSH, Allergies, Home Meds, ROS, Nursing Notes, Physical Exam, Encounter  Assessment and Plan, CC Referring Physician   Last Updated: 15-Jul-13 15:46 by Rebeca Alerthrystal, Darrah Dredge S (MD)

## 2014-05-09 NOTE — Discharge Summary (Signed)
PATIENT NAME:  Enzo MontgomeryHODGIN, Risa N MR#:  [id number] DATE OF BIRTH:  1950-09-05  DATE OF ADMISSION:  01/03/2011 DATE OF DISCHARGE:  01/07/2011  HISTORY OF PRESENT ILLNESS: Ms. Rodena MedinHodgin was a known patient to me with undifferentiated sarcoma who recently had undergone debulking surgery. Postoperatively the patient continued to have poor appetite, low grade fever, leukocytosis, and increasing abdominal pain. The patient failed outpatient conservative therapy with antibiotics. So the patient had a CT scan which revealed possibility of abscess in the abdomen. In view of these findings, the patient was admitted in the hospital for further evaluation and treatment consideration.   The patient's past history and social and family history have been recorded in the previous admission note.   AVAILABLE LAB DATA AND X-RAY DATA: Glucose 211. Sodium 131. SGOT 46.   White count 12.5 and hemoglobin 9.8.   On 01/07/2011 hemoglobin has dropped to 8.4.   CT scan revealed within the pelvis there is dominate necrotic mass which contains air fluid level. Enhancing lobulated walls are consistent with residual neoplasm. There are new hypodensities within the liver.   HOSPITAL COURSE: During the hospital stay, the patient was started on IV antibiotics. Urine culture was obtained. Blood cultures were negative. Urine culture was negative. During the hop stay, the patient started passing gas through her vagina and then later on liquid fecal material. Surgical consultation was obtained. Colovesical fistula was suspected. The patient underwent discussion regarding all the options of treatment including diverting colostomy as well as resection of the fistula process. The patient also had anemia due to multifactorial reasons like infection and possible GI bleeding. She received packed cell transfusion. She started feeling better. At that time, the patient and family decided because of Christmas time they would like to go home and  discuss all of the options given to them. Surgical consultations with Dr. Michela PitcherEly and his associated were obtained.    DISCHARGE MEDICATIONS:  1. Xanax 0.5 mg every 8 hours for anxiety.  2. Celexa 20 mg p.o. daily.  3. Levaquin 500 mg daily for seven days, starting from 01/08/2011.  DIET: Full liquid diet.   ACTIVITY: As tolerated.  FOLLOWUP: The patient will have an appointment to see Dr. Doylene Canninghoksi as well as Dr. Michela PitcherEly after Christmas or she can go to the emergency room if the patient starts spiking fever.   FINAL DIAGNOSES:  1. 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  2. Anxiety and depression.  3. Low-grade fever secondary to colovesical fistula.  4. Anemia, transfusion dependent. ____________________________ Gerome SamJanak K. Bennie Scaff, MD jkc:slb D: 01/26/2011 16:07:00 ET T: 01/27/2011 15:46:18 ET JOB#: [id number]  cc: Valarie ConesJanak K. Doylene Canninghoksi, MD, <Dictator> Laddie AquasJANAK K Ijeoma Loor MD ELECTRONICALLY SIGNED 01/28/2011 18:42

## 2014-05-09 NOTE — Discharge Summary (Signed)
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 She had undergone a pelvic debulking procedure and omentectomy around late November of 2012.  She developed a residual distal obstruction in the sigmoid mesentery resulting in the eventual development of a colovaginal fistula. She was not felt to be a candidate for resection of this tumor as she has developed new liver metastases by scan. She did, however,  want to consider palliative procedures so was admitted for a proximal colostomy.   After appropriate preoperative preparation and informed consent, she was taken to surgery in the morning of 01/24/2011 where she underwent a transverse colon, double barrel colostomy. The procedure was uncomplicated. A Port-A-Cath device was placed in the same setting. She had no significant intraoperative problems. She had slow return of bowel function, was actually able to tolerate a regular diet and was discharged home on the 11/30/2011 to be followed in the office in 7 to 10 days' time. Bathing, activity and driving instructions were given to the patient.   DISCHARGE MEDICATIONS: She is to resume her home medications including her pain medication.    ____________________________ Quentin Orealph L. Ely III, MD rle:vtd D: 02/03/2011 12:04:02 ET T: 02/03/2011 13:52:10 ET JOB#: [id number]  cc: Quentin Orealph L. Ely III, MD, <Dictator> Gerome SamJanak K. Doylene Canninghoksi, MD Quentin OreALPH L ELY MD ELECTRONICALLY SIGNED 02/07/2011 7:56

## 2014-05-09 NOTE — Op Note (Signed)
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   PROCEDURES:  1. Colostomy mucous fistula.  2. Port-A-Cath placement.   ANESTHESIA: General    SURGEON: Quentin Ore III, MD  ASSISTANT: Dr. Dionne Milo  DESCRIPTION OF PROCEDURE: With the patient in supine position after induction of appropriate general anesthesia, the patient's abdomen was prepped with Betadine, draped in sterile towels. An alcohol wipe, Betadine-impregnated Steri-Drape were utilized. An upper midline incision was made extending down just from above the umbilicus to below the umbilicus and carried through the subcutaneous tissue, bovied with electrocautery. Midline fascia identified and opened the length of skin incision. There were multiple tumor implants identified. The transverse colon was easily mobilized into the incision and small bowel adhesions taken down. The decision was made to divide the mid transverse colon and bring up a proximal colostomy and a distal mucous fistula. The bowel was divided with a single application of the GIA-75 stapler carrying a blue load. The mesentery was divided back several centimeters using the LigaSure apparatus. An area was identified on both sides of the abdomen for the ostomy and mucous fistula. Skin incision was made without difficulty and incision carried down through the subcutaneous tissue, bovied with electrocautery. The anterior fascia was divided in a cruciate manner and a muscle-splitting incision performed on the rectus muscle. The posterior fascia was opened in a similar manner. The ostomy was brought through the right side and the mucous fistula through the left side. The abdomen otherwise was satisfactorily hemostatic. Midline fascia  was closed with running suture of 0 Maxon tied in the middle and stabled with skin clips. Ostomy was matured with 3-0 Vicryl sutures. Ostomy bags were applied. Drapes were taken down and the team rescrubbed and re-gowned. The patient was moved to the Trendelenburg  position and the neck properly supported and the shoulder roll placed as normal. The right chest was prepped with ChloraPrep and draped with sterile towels. A small incision was made in the infraclavicular area and carried down bluntly through the subcutaneous tissue. On a single pass the subclavian vein could not be cannulated. The ultrasound was brought to the table and after a small neck incision was performed the jugular vein was cannulated under direct vision. The ultrasound was used for the procedure. A wire was passed in the right heart under fluoroscopic control. The wire was left in the great vessel system while incision was made on the chest wall in a transverse fashion for the port. A suprafascial subcutaneous pocket was created without difficulty. The introducer and dilator were inserted over the wire, the dilator and wire removed. Heparin filled catheter was inserted through the introducer into the great vessel system, positioned just above the atrium. Contrast was used to provide satisfactory placement. The catheter was then tunneled to the second incision where it was attached to a heparin filled Port-A-Cath device. Again, was contrast used to confirm the absence of any kinks or leaks and also to confirm the position in the great vessel system. The device was then flushed with heparinized saline. Device was sutured to the chest wall using 3-0 silk. Subcutaneous space obliterated with 3-0 Vicryl and all incisions closed with 4-0 nylon. Sterile dressings were applied. Patient returned to the recovery room having tolerated the procedure well. Sponge, instrument, and needle counts  were correct x2 in the Operating Room.     ____________________________ Quentin Orealph L. Ely III, MD rle:cms D: 01/24/2011 11:43:48 ET T: 01/24/2011 12:30:35 ET JOB#: [id number]  cc: Carmie Endalph L. Ely III, MD, <Dictator> Maxine GlennBrigitte E. Miller, MD Gerome SamJanak K. Doylene Canninghoksi, MD Quentin OreALPH L ELY MD ELECTRONICALLY SIGNED 01/24/2011 22:57
